# Patient Record
Sex: Male | Born: 2005 | Race: Black or African American | Hispanic: No | Marital: Single | Smoking: Never smoker
Health system: Southern US, Community
[De-identification: ages and names within clinical notes are randomized; demographics above are authoritative.]

## PROBLEM LIST (undated history)

## (undated) DIAGNOSIS — J353 Hypertrophy of tonsils with hypertrophy of adenoids: Secondary | ICD-10-CM

---
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
## 2023-11-17 ENCOUNTER — Emergency Department (HOSPITAL_BASED_OUTPATIENT_CLINIC_OR_DEPARTMENT_OTHER)
Admission: EM | Admit: 2023-11-17 | Discharge: 2023-11-17 | Disposition: A | Discharge status: Home / Self Care | Payer: Medicaid Other | Attending: Emergency Medicine | Admitting: Emergency Medicine

## 2023-11-17 ENCOUNTER — Emergency Department (HOSPITAL_BASED_OUTPATIENT_CLINIC_OR_DEPARTMENT_OTHER): Payer: Medicaid Other

## 2023-11-17 ENCOUNTER — Other Ambulatory Visit: Payer: Self-pay

## 2023-11-17 DIAGNOSIS — Z7951 Long term (current) use of inhaled steroids: Secondary | ICD-10-CM | POA: Diagnosis not present

## 2023-11-17 DIAGNOSIS — R0982 Postnasal drip: Secondary | ICD-10-CM | POA: Insufficient documentation

## 2023-11-17 DIAGNOSIS — Z1152 Encounter for screening for COVID-19: Secondary | ICD-10-CM | POA: Diagnosis not present

## 2023-11-17 DIAGNOSIS — R051 Acute cough: Secondary | ICD-10-CM | POA: Diagnosis present

## 2023-11-17 DIAGNOSIS — J45909 Unspecified asthma, uncomplicated: Secondary | ICD-10-CM | POA: Diagnosis not present

## 2023-11-17 LAB — RESP PANEL BY RT-PCR (RSV, FLU A&B, COVID)  RVPGX2
Influenza A by PCR: NEGATIVE
Influenza B by PCR: NEGATIVE
Resp Syncytial Virus by PCR: NEGATIVE
SARS Coronavirus 2 by RT PCR: NEGATIVE

## 2023-11-17 MED ORDER — PANTOPRAZOLE SODIUM 20 MG PO TBEC: 20.0000 mg | DELAYED_RELEASE_TABLET | Freq: Every day | ORAL | 0 refills | Status: AC

## 2023-11-17 MED ORDER — OXYMETAZOLINE HCL 0.05 % NA SOLN: 1.0000 | Freq: Two times a day (BID) | NASAL | 0 refills | Status: AC

## 2023-11-17 MED ORDER — CETIRIZINE HCL 10 MG PO TABS: 10.0000 mg | ORAL_TABLET | Freq: Every day | ORAL | 0 refills | Status: AC

## 2023-11-17 NOTE — ED Triage Notes (Signed)
For the last 2 weeks pt has been coughing from secretions. Pt states that he started coughing and feels like something is stuck and when he is done coughing he has to catch his breath. Denies CP SOB. Pt feels fine right now

## 2023-11-17 NOTE — ED Provider Notes (Signed)
[facility] Provider Note  CSN: [id number] Arrival date & time: 11/17/23 9528  Chief Complaint(s) Cough  HPI Zachary Kramer is a 17 y.o. male with past medical history as below, significant for, ADHD, tonsillectomy who presents to the ED with complaint of cough with postnasal drip  Patient here with mother and sister with 2 to 3 weeks of postnasal drip, cough congestion.  Patient reports cough is worse in the nighttime, especially with lying flat.  He has postnasal drip feels that he is stuck in his throat, has difficulty clearing secretions at times.  At times he is coughing fits and has difficulty breathing associated with excessive coughing episodes.  No medications prior to arrival.  No fevers or chills.  Currently he is asymptomatic  Past Medical History Past Medical History:  Diagnosis Date   Asthma    prn inhaler/neb.   Tonsillar and adenoid hypertrophy 02/2012   snores during sleep, occ. stops breathing, occ. wakes up coughing/choking, per mother   Patient Active Problem List   Diagnosis Date Noted   ADHD (attention deficit hyperactivity disorder), predominantly hyperactive impulsive type 10/31/2014   Learning problem 08/28/2014   Inattention 08/28/2014   Home Medication(s) Prior to Admission medications   Medication Sig Start Date End Date Taking? Authorizing Provider  cetirizine (ZYRTEC ALLERGY) 10 MG tablet Take 1 tablet (10 mg total) by mouth daily. 11/17/23 12/17/23 Yes Sloan Leiter, DO  oxymetazoline (AFRIN NASAL SPRAY) 0.05 % nasal spray Place 1-2 sprays into both nostrils 2 (two) times daily for 3 days. 11/17/23 11/20/23 Yes Tanda Rockers A, DO  pantoprazole (PROTONIX) 20 MG tablet Take 1 tablet (20 mg total) by mouth daily for 14 days. 11/17/23 12/01/23 Yes Tanda Rockers A, DO  albuterol (PROVENTIL HFA;VENTOLIN HFA) 108 (90 BASE) MCG/ACT inhaler Inhale 2 puffs into the lungs every 4 (four) hours as needed for wheezing or shortness  of breath.     [provider]  cephALEXin (KEFLEX) 250 MG capsule Take 1 capsule (250 mg total) by mouth 4 (four) times daily. 09/29/17   Rosezella Rumpf, PA-C  methylphenidate (CONCERTA) 18 MG PO CR tablet Take 1 tablet (18 mg total) by mouth every morning. Patient not taking: Reported on 09/29/2017 11/22/14   Leatha Gilding, MD                                                                                                                                    Past Surgical History Past Surgical History:  Procedure Laterality Date   TONSILLECTOMY AND ADENOIDECTOMY  02/29/2012   Procedure: TONSILLECTOMY AND ADENOIDECTOMY;  Surgeon: Darletta Moll, MD;  Location: [facility];  Service: ENT;  Laterality: Bilateral;   Family History Family History  Problem Relation Age of Onset   Asthma Mother    Asthma Sister    Asthma Brother    Diabetes Maternal Grandmother  Hypertension Maternal Grandmother     Social History Social History   Tobacco Use   Smoking status: Never   Smokeless tobacco: Never  Substance Use Topics   Alcohol use: No   Drug use: No   Allergies Shellfish-derived products and Other  Review of Systems Review of Systems  Constitutional:  Negative for chills and fever.  HENT:  Positive for congestion and postnasal drip.   Respiratory:  Positive for cough. Negative for wheezing.   Cardiovascular:  Negative for chest pain.  Gastrointestinal:  Negative for abdominal pain and vomiting.  Skin:  Negative for rash.  Neurological:  Negative for numbness and headaches.  All other systems reviewed and are negative.   Physical Exam Vital Signs  I have reviewed the triage vital signs BP 123/78   Pulse 94   Temp (!) 97.2 F (36.2 C)   Resp 17   Ht 6\' 2"  (1.88 m)   Wt 83.9 kg   SpO2 100%   BMI 23.75 kg/m  Physical Exam Vitals and nursing note reviewed.  Constitutional:      General: He is not in acute distress.    Appearance: He is  well-developed.  HENT:     Head: Normocephalic and atraumatic. No right periorbital erythema or left periorbital erythema.     Right Ear: External ear normal.     Left Ear: External ear normal.     Mouth/Throat:     Mouth: Mucous membranes are moist.     Dentition: Normal dentition.     Pharynx: Uvula midline. Postnasal drip present. No oropharyngeal exudate.     Comments: Cobblestoning noted to posterior oropharynx  Status post tonsillectomy Eyes:     General: No scleral icterus. Cardiovascular:     Rate and Rhythm: Normal rate and regular rhythm.     Pulses: Normal pulses.     Heart sounds: Normal heart sounds.  Pulmonary:     Effort: Pulmonary effort is normal. No respiratory distress.     Breath sounds: Normal breath sounds. No wheezing or rales.  Abdominal:     General: Abdomen is flat.     Palpations: Abdomen is soft.     Tenderness: There is no abdominal tenderness.  Musculoskeletal:     Cervical back: No rigidity.     Right lower leg: No edema.     Left lower leg: No edema.  Skin:    General: Skin is warm and dry.     Capillary Refill: Capillary refill takes less than 2 seconds.  Neurological:     Mental Status: He is alert.  Psychiatric:        Mood and Affect: Mood normal.        Behavior: Behavior normal.     ED Results and Treatments Labs (all labs ordered are listed, but only abnormal results are displayed) Labs Reviewed  RESP PANEL BY RT-PCR (RSV, FLU A&B, COVID)  RVPGX2  Radiology DG Chest 2 View  Result Date: 11/17/2023 CLINICAL DATA:  cough EXAM: CHEST - 2 VIEW COMPARISON:  Chest XR, 10/29/2006. FINDINGS: Cardiomediastinal silhouette is within normal limits. Lungs are well inflated. No focal consolidation or mass. No pleural effusion or pneumothorax. No acute displaced fracture. IMPRESSION: No acute cardiopulmonary process.  Electronically Signed   By: Roanna Banning M.D.   On: 11/17/2023 04:51    Pertinent labs & imaging results that were available during my care of the patient were reviewed by me and considered in my medical decision making (see MDM for details).  Medications Ordered in ED Medications - No data to display                                                                                                                                   Procedures Procedures  (including critical care time)  Medical Decision Making / ED Course    Medical Decision Making:    Zachary Kramer is a 17 y.o. male  with past medical history as below, significant for, ADHD, tonsillectomy who presents to the ED with complaint of cough with postnasal drip. The complaint involves an extensive differential diagnosis and also carries with it a high risk of complications and morbidity.  Serious etiology was considered. Ddx includes but is not limited to: Viral syndrome, COVID-19, pharyngitis, pneumonia, postnasal drip, etc.  Complete initial physical exam performed, notably the patient was in no acute distress, sitting upright, no drooling stridor.    Reviewed and confirmed nursing documentation for past medical history, family history, social history.  Vital signs reviewed.         Brief summary:  17 year old male with history as above here with cough, postnasal drip.  Ongoing 2 to 3 weeks.  No medications tried at home.  Has not seen PCP for this complaint.  Concerned that patient's postnasal drip is likely provoking the cough.  Start on antihistamine / nasal spray.  Mother also requesting medication for GERD, pt reports occ burning sensation at night time after dinner. Recommend bland diet, ppi x2 wks.   RVP was negative, chest x-ray was negative  The patient improved significantly and was discharged in stable condition. Detailed discussions were had with the patient regarding current findings, and need for close f/u with  PCP or on call doctor. The patient has been instructed to return immediately if the symptoms worsen in any way for re-evaluation. Patient verbalized understanding and is in agreement with current care plan. All questions answered prior to discharge.                  Additional history obtained: -Additional history obtained from mother/sister -External records from outside source obtained and reviewed including: Chart review including previous notes, labs, imaging, consultation notes including  Prior ED visits, prior labs and imaging, home medications   Lab Tests: -I ordered, reviewed, and interpreted labs.   The pertinent results  include:   Labs Reviewed  RESP PANEL BY RT-PCR (RSV, FLU A&B, COVID)  RVPGX2    Notable for negative RVP  EKG   EKG Interpretation Date/Time:    Ventricular Rate:    PR Interval:    QRS Duration:    QT Interval:    QTC Calculation:   R Axis:      Text Interpretation:           Imaging Studies ordered: I ordered imaging studies including chest x-ray I independently visualized the following imaging with scope of interpretation limited to determining acute life threatening conditions related to emergency care; findings noted above I independently visualized and interpreted imaging. I agree with the radiologist interpretation   Medicines ordered and prescription drug management: Meds ordered this encounter  Medications   cetirizine (ZYRTEC ALLERGY) 10 MG tablet    Sig: Take 1 tablet (10 mg total) by mouth daily.    Dispense:  30 tablet    Refill:  0   oxymetazoline (AFRIN NASAL SPRAY) 0.05 % nasal spray    Sig: Place 1-2 sprays into both nostrils 2 (two) times daily for 3 days.    Dispense:  15 mL    Refill:  0   pantoprazole (PROTONIX) 20 MG tablet    Sig: Take 1 tablet (20 mg total) by mouth daily for 14 days.    Dispense:  14 tablet    Refill:  0    -I have reviewed the patients home medicines and have made adjustments  as needed   Consultations Obtained: na   Cardiac Monitoring: Continuous pulse oximetry interpreted by myself, 100% on RA.    Social Determinants of Health:  Diagnosis or treatment significantly limited by social determinants of health: lives at home   Reevaluation: After the interventions noted above, I reevaluated the patient and found that they have improved  Co morbidities that complicate the patient evaluation  Past Medical History:  Diagnosis Date   Asthma    prn inhaler/neb.   Tonsillar and adenoid hypertrophy 02/2012   snores during sleep, occ. stops breathing, occ. wakes up coughing/choking, per mother      Dispostion: Disposition decision including need for hospitalization was considered, and patient discharged from emergency department.    Final Clinical Impression(s) / ED Diagnoses Final diagnoses:  Post-nasal drip  Acute cough        Sloan Leiter, DO 11/17/23 0865

## 2023-11-17 NOTE — Discharge Instructions (Addendum)
It was a pleasure caring for you today in the emergency department. ° °Please return to the emergency department for any worsening or worrisome symptoms. ° ° °
